# Patient Record
Sex: Female | Born: 1995 | Race: Black or African American | Hispanic: No | Marital: Married | Smoking: Never smoker
Health system: Southern US, Community
[De-identification: ages and names within clinical notes are randomized; demographics above are authoritative.]

---
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
## 2016-10-18 ENCOUNTER — Encounter (HOSPITAL_COMMUNITY): Payer: Self-pay | Admitting: Emergency Medicine

## 2016-10-18 ENCOUNTER — Emergency Department (HOSPITAL_COMMUNITY): Payer: BLUE CROSS/BLUE SHIELD

## 2016-10-18 ENCOUNTER — Emergency Department (HOSPITAL_COMMUNITY)
Admission: EM | Admit: 2016-10-18 | Discharge: 2016-10-18 | Disposition: A | Discharge status: Home / Self Care | Payer: BLUE CROSS/BLUE SHIELD | Attending: Emergency Medicine | Admitting: Emergency Medicine

## 2016-10-18 DIAGNOSIS — Y999 Unspecified external cause status: Secondary | ICD-10-CM | POA: Diagnosis not present

## 2016-10-18 DIAGNOSIS — Y939 Activity, unspecified: Secondary | ICD-10-CM | POA: Diagnosis not present

## 2016-10-18 DIAGNOSIS — W228XXA Striking against or struck by other objects, initial encounter: Secondary | ICD-10-CM | POA: Diagnosis not present

## 2016-10-18 DIAGNOSIS — Y92009 Unspecified place in unspecified non-institutional (private) residence as the place of occurrence of the external cause: Secondary | ICD-10-CM | POA: Insufficient documentation

## 2016-10-18 DIAGNOSIS — S90112A Contusion of left great toe without damage to nail, initial encounter: Secondary | ICD-10-CM | POA: Diagnosis not present

## 2016-10-18 DIAGNOSIS — S99922A Unspecified injury of left foot, initial encounter: Secondary | ICD-10-CM | POA: Diagnosis present

## 2016-10-18 MED ORDER — OXYCODONE HCL 5 MG PO TABS
5.0000 mg | ORAL_TABLET | Freq: Once | ORAL | Status: AC
  Administered 2016-10-18: 5 mg via ORAL
  Filled 2016-10-18: qty 1

## 2016-10-18 MED ORDER — HYDROCODONE-ACETAMINOPHEN 5-325 MG PO TABS: 1.0000 | ORAL_TABLET | Freq: Four times a day (QID) | ORAL | 0 refills | Status: DC | PRN

## 2016-10-18 MED ORDER — ACETAMINOPHEN 500 MG PO TABS
1000.0000 mg | ORAL_TABLET | Freq: Once | ORAL | Status: AC
  Administered 2016-10-18: 1000 mg via ORAL
  Filled 2016-10-18: qty 2

## 2016-10-18 NOTE — ED Provider Notes (Signed)
[facility]-EMERGENCY DEPT Provider Note   CSN: [id number] Arrival date & time: 10/18/16  1649  By signing my name below, I, Orpah CobbMaurice Copeland, attest that this documentation has been prepared under the direction and in the presence of Felicie Mornavid Cayle Cordoba, NP-C. Electronically Signed: Orpah CobbMaurice Copeland , ED Scribe. 10/18/16. 6:48 PM.    History   Chief Complaint Chief Complaint  Patient presents with  . Foot Injury    HPI  HPI Comments: Anne Cortez is a 20 y.o. female who presents to the Emergency Department complaining of moderate L great toe pain with sudden onset x1 hour. Pt states that while moving things around she stubbed her L great toe against . She states that the area is TTP and that ROM produces pain. Pt is on birth control. Pt denies hx of diabetes mellitus, any significant medical hx.    The history is provided by the patient. No language interpreter was used.  Foot Injury   The incident occurred 1 to 2 hours ago. The incident occurred at home. Injury mechanism: stubbed her L great toe against furniture. Pain location: L great toe. The pain is mild. The pain has been constant since onset. Pertinent negatives include no loss of motion. She reports no foreign bodies present. The symptoms are aggravated by palpation, bearing weight and activity. She has tried nothing for the symptoms.    History reviewed. No pertinent past medical history.  There are no active problems to display for this patient.   History reviewed. No pertinent surgical history.  OB History    No data available       Home Medications    Prior to Admission medications   Not on File    Family History No family history on file.  Social History Social History  Substance Use Topics  . Smoking status: Not on file  . Smokeless tobacco: Not on file  . Alcohol use Not on file     Allergies   Shellfish allergy   Review of Systems Review of Systems  Constitutional: Negative for chills and  fever.  HENT: Negative for ear pain and sore throat.   Eyes: Negative for pain and visual disturbance.  Respiratory: Negative for cough and shortness of breath.   Cardiovascular: Negative for chest pain and palpitations.  Gastrointestinal: Negative for abdominal pain and vomiting.  Genitourinary: Negative for dysuria and hematuria.  Musculoskeletal: Negative for arthralgias and back pain.  Skin: Positive for wound (L great toe with subungual hematoma). Negative for color change and rash.  Neurological: Negative for seizures and syncope.  All other systems reviewed and are negative.    Physical Exam Updated Vital Signs BP 120/69 (BP Location: Right Arm)   Pulse 73   Temp 98.6 F (37 C) (Oral)   Resp 18   Ht 5\' 4"  (1.626 m)   Wt 116 lb (52.6 kg)   LMP 10/07/2016   SpO2 100%   BMI 19.91 kg/m   Physical Exam  Constitutional: She appears well-developed and well-nourished. No distress.  HENT:  Head: Normocephalic and atraumatic.  Eyes: Conjunctivae are normal.  Neck: Neck supple.  Cardiovascular: Normal rate and regular rhythm.   No murmur heard. Pulmonary/Chest: Effort normal and breath sounds normal. No respiratory distress.  Abdominal: Soft. There is no tenderness.  Musculoskeletal: She exhibits no edema.       Left foot: There is tenderness.       Feet:  Neurological: She is alert.  Skin: Skin is warm and dry.  Psychiatric:  She has a normal mood and affect.  Nursing note and vitals reviewed.      ED Treatments / Results   DIAGNOSTIC STUDIES: Oxygen Saturation is 100% on RA, normal by my interpretation.   COORDINATION OF CARE: 6:49 PM-Discussed next steps with pt. Pt verbalized understanding and is agreeable with the plan.    Labs (all labs ordered are listed, but only abnormal results are displayed) Labs Reviewed - No data to display  EKG  EKG Interpretation None       Radiology Dg Foot Complete Left  Result Date: 10/18/2016 CLINICAL DATA:   Left foot pain bleeding great toe after injury EXAM: LEFT FOOT - COMPLETE 3+ VIEW COMPARISON:  None. FINDINGS: Three views of the left foot submitted. No acute fracture or subluxation. There is bandage artifact distal aspect great toe. IMPRESSION: Negative. Electronically Signed   By: Natasha MeadLiviu  Pop M.D.   On: 10/18/2016 17:46    Procedures Procedures (including critical care time)  Medications Ordered in ED Medications - No data to display   Initial Impression / Assessment and Plan / ED Course  I have reviewed the triage vital signs and the nursing notes.  Pertinent labs & imaging results that were available during my care of the patient were reviewed by me and considered in my medical decision making (see chart for details).  Clinical Course   Patient X-Ray negative for obvious fracture or dislocation.  Patient has small subungual hematoma that was drained in ED.  Conservative therapy recommended and discussed. Follow-up with ortho in no improvement. Patient will be discharged home & is agreeable with above plan. Returns precautions discussed. Pt appears safe for discharge.    Final Clinical Impressions(s) / ED Diagnoses   Final diagnoses:  Contusion of left great toe without damage to nail, initial encounter    New Prescriptions Discharge Medication List as of 10/18/2016  9:55 PM    START taking these medications   Details  HYDROcodone-acetaminophen (NORCO/VICODIN) 5-325 MG tablet Take 1 tablet by mouth every 6 (six) hours as needed for severe pain., Starting Thu 10/18/2016, Print       I personally performed the services described in this documentation, which was scribed in my presence. The recorded information has been reviewed and is accurate.    Felicie Mornavid Rc Amison, NP 10/19/16 [id number]    Gwyneth SproutWhitney Plunkett, MD 10/20/16 [phone]

## 2016-10-18 NOTE — ED Triage Notes (Signed)
Pt reports dropping a candle on her left toe and has a laceration on left great toe. Pt states its difficult to walk.

## 2016-10-18 NOTE — ED Notes (Signed)
Pt given "Know Your Options" handout and "Free E-Visit" coupon upon discharge.   Pt departed in NAD, refused use of wheelchair. Meeting friend out front for ride home.

## 2018-01-11 IMAGING — DX DG FOOT COMPLETE 3+V*L*
3 series · 3 of 3 positions shown · non-contrast
Comparison: None.

CLINICAL DATA: Left foot pain bleeding great toe after injury

EXAM:
LEFT FOOT - COMPLETE 3+ VIEW

[x foot ap left]
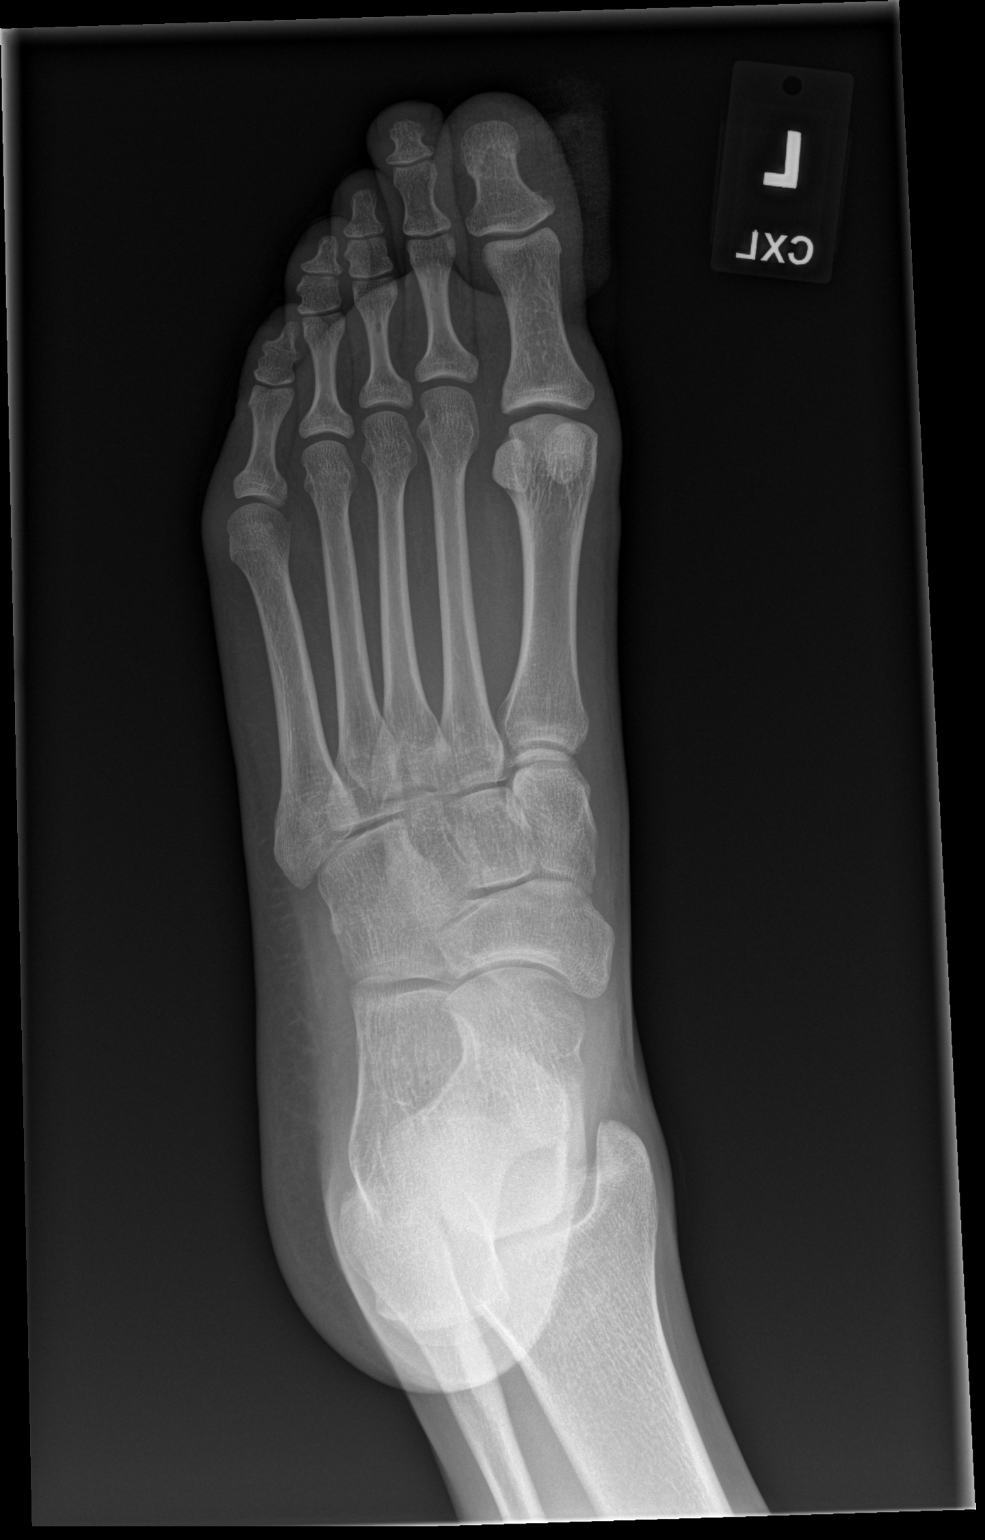

[x foot obl left]
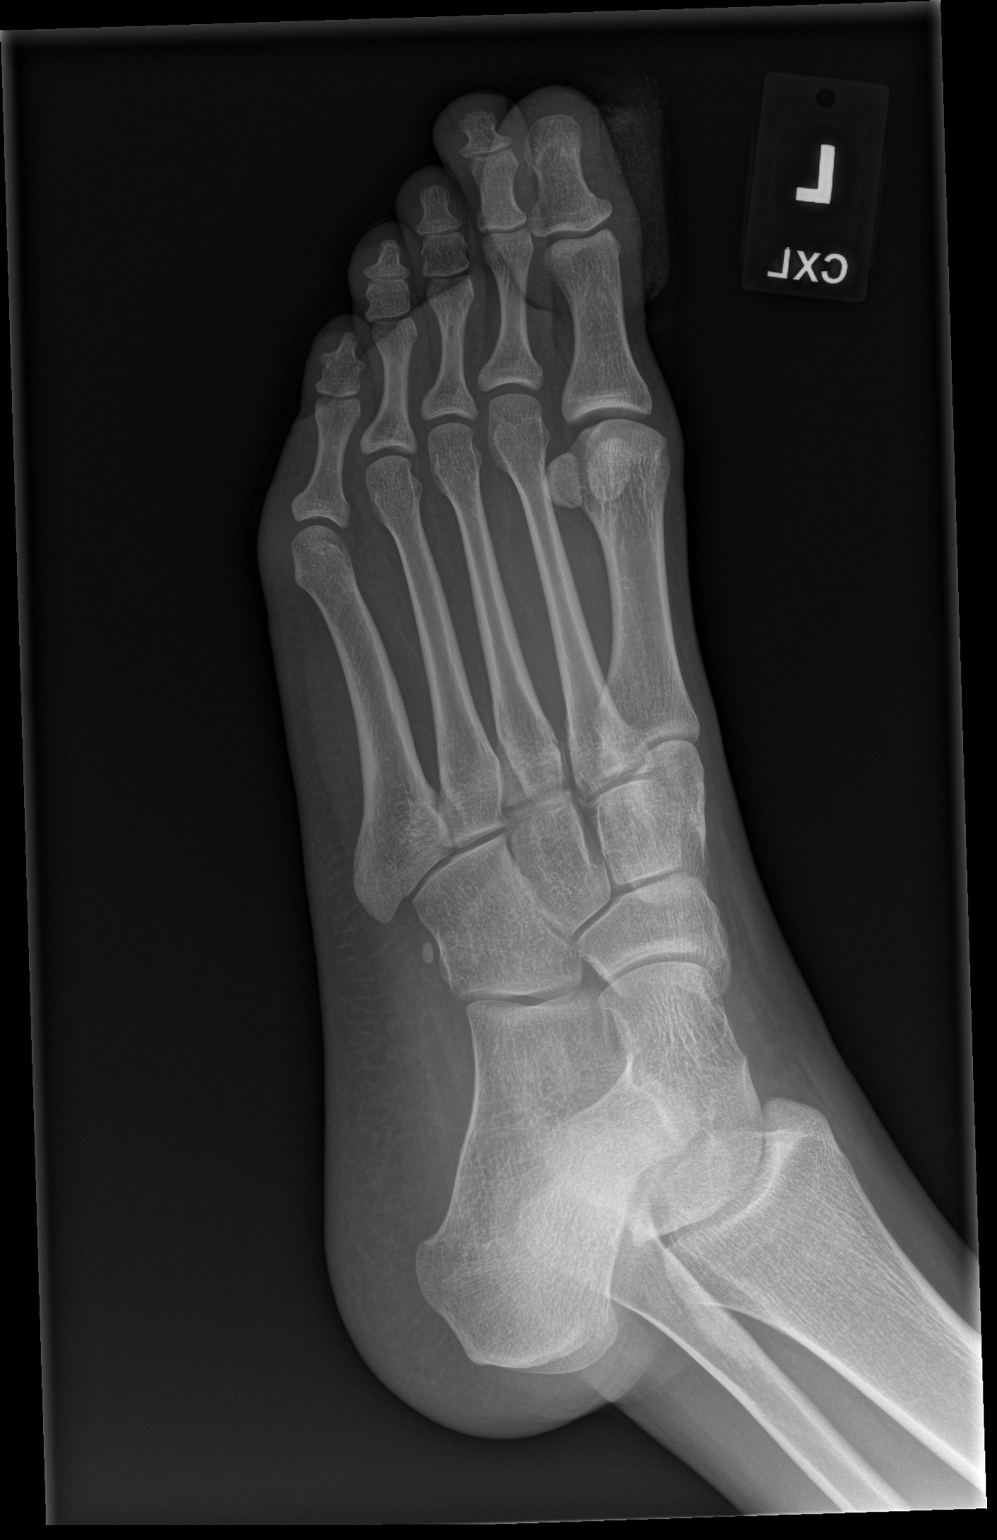

[x foot lat left]
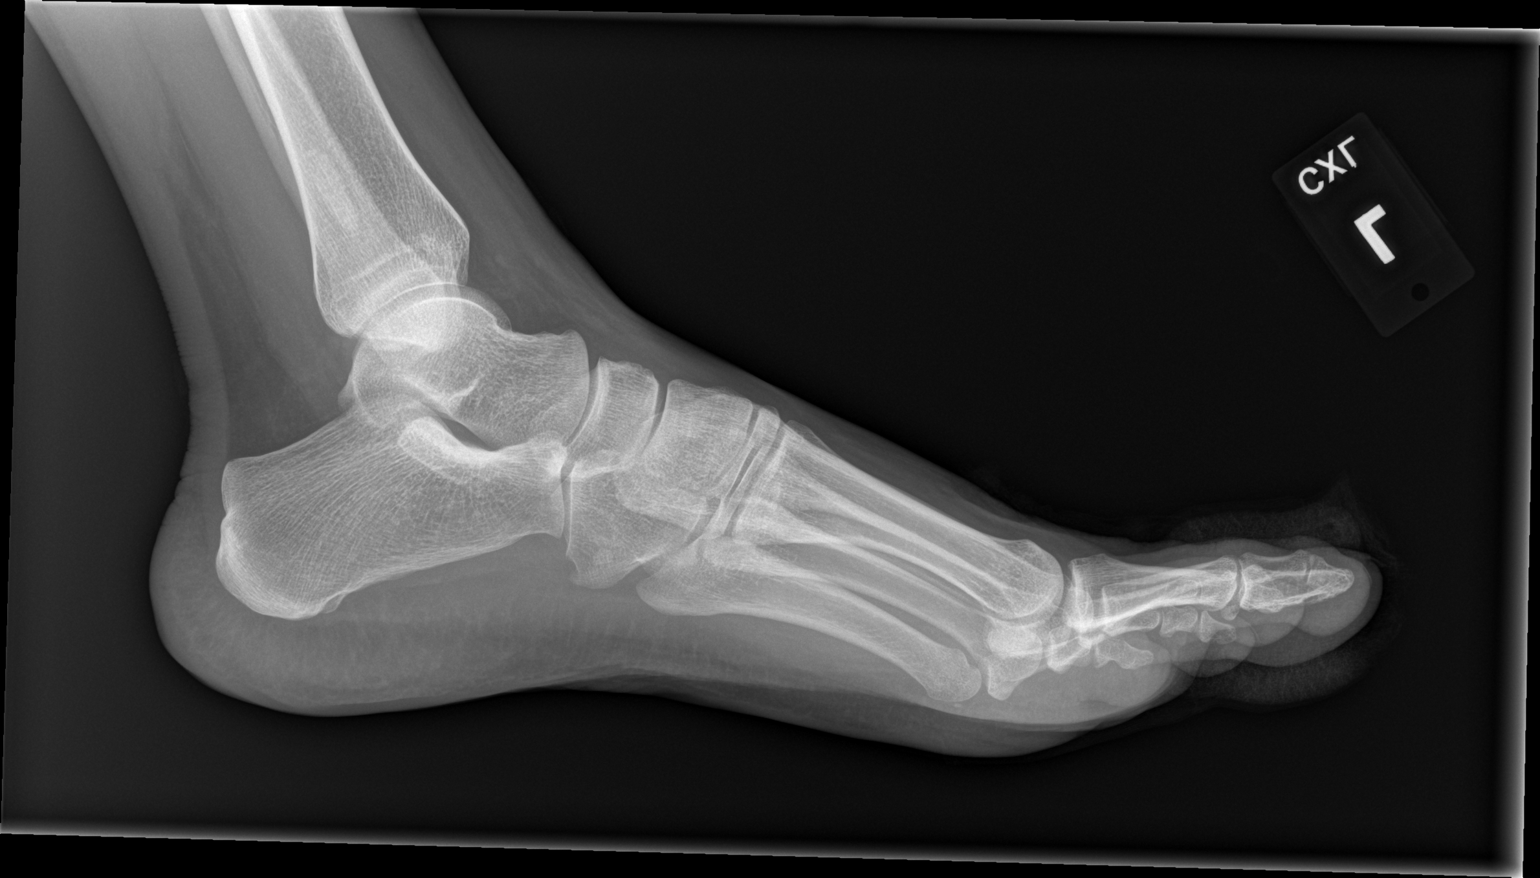

[3 of 3 positions shown; findings below may reference images not displayed]

FINDINGS: Three views of the left foot submitted. No acute fracture or
subluxation. There is bandage artifact distal aspect great toe.
IMPRESSION: Negative.

## 2019-03-23 ENCOUNTER — Other Ambulatory Visit: Payer: Self-pay

## 2019-03-23 DIAGNOSIS — R21 Rash and other nonspecific skin eruption: Secondary | ICD-10-CM | POA: Insufficient documentation

## 2019-03-24 ENCOUNTER — Emergency Department (HOSPITAL_COMMUNITY)
Admission: EM | Admit: 2019-03-24 | Discharge: 2019-03-24 | Disposition: A | Discharge status: Home / Self Care | Payer: BLUE CROSS/BLUE SHIELD | Attending: Emergency Medicine | Admitting: Emergency Medicine

## 2019-03-24 ENCOUNTER — Other Ambulatory Visit: Payer: Self-pay

## 2019-03-24 ENCOUNTER — Encounter (HOSPITAL_COMMUNITY): Payer: Self-pay | Admitting: Emergency Medicine

## 2019-03-24 DIAGNOSIS — R21 Rash and other nonspecific skin eruption: Secondary | ICD-10-CM

## 2019-03-24 MED ORDER — DIPHENHYDRAMINE HCL 25 MG PO CAPS
25.0000 mg | ORAL_CAPSULE | Freq: Once | ORAL | Status: AC
  Administered 2019-03-24: 25 mg via ORAL
  Filled 2019-03-24: qty 1

## 2019-03-24 MED ORDER — DIPHENHYDRAMINE HCL 25 MG PO TABS
25.0000 mg | ORAL_TABLET | Freq: Three times a day (TID) | ORAL | 0 refills | Status: AC | PRN
Start: 2019-03-24 — End: ?

## 2019-03-24 MED ORDER — PRENATAL MULTIVITAMIN CH: 1.0000 | ORAL_TABLET | Freq: Every day | ORAL | 0 refills | Status: AC

## 2019-03-24 NOTE — ED Triage Notes (Signed)
Pt arriving POV with complaint of rash that is covering her entire body. Pt reports the rash is itchy but not painful. Rash presents as small bumps, no whitehead.

## 2019-03-24 NOTE — ED Provider Notes (Signed)
[facility]-EMERGENCY DEPT Provider Note   CSN: [id number] Arrival date & time: 03/23/19  2335    History   Chief Complaint Chief Complaint  Patient presents with  . Rash    HPI Anne A Blase MessHaynesworth is a 23 y.o. female.     HPI  This is a 23 year old G1, P0 female currently [redacted] weeks pregnant by last menstrual period who presents with a rash.  Patient reports over the last 2 to 3 days she has developed a rash "all over my body."  She reports rash on the extremities, trunk, back, and buttock.  She denies any new exposures including soaps, detergents, lotions, food, medications.  She reports that the rash is itchy and nonpainful.  She denies any fevers or systemic symptoms.  She has not taken anything for her symptoms.  She denies any tick exposures or rash noted on the palms or the soles.  Regarding her pregnancy, she has not seen her OB/GYN yet.  She reports vomiting but no loss of fluids or vaginal bleeding.  She denies any abdominal pain.  After initial evaluation, patient reports that she is having some abdominal cramping.  She reports that it is crampy at a level of 2 out of 10.  It does not lateralize to the right or to the left.  She has had intermittent episodes of crampiness since finding out that she was pregnant.  Denies any vaginal bleeding.  History reviewed. No pertinent past medical history.  There are no active problems to display for this patient.   History reviewed. No pertinent surgical history.   OB History    Gravida  1   Para      Term      Preterm      AB      Living        SAB      TAB      Ectopic      Multiple      Live Births               Home Medications    Prior to Admission medications   Medication Sig Start Date End Date Taking? Authorizing Provider  diphenhydrAMINE (BENADRYL) 25 MG tablet Take 1 tablet (25 mg total) by mouth every 8 (eight) hours as needed for itching. 03/24/19   Horton, Mayer Maskerourtney F, MD   HYDROcodone-acetaminophen (NORCO/VICODIN) 5-325 MG tablet Take 1 tablet by mouth every 6 (six) hours as needed for severe pain. 10/18/16   Felicie MornSmith, David, NP  Prenatal Vit-Fe Fumarate-FA (PRENATAL MULTIVITAMIN) TABS tablet Take 1 tablet by mouth daily at 12 noon. 03/24/19   Horton, Mayer Maskerourtney F, MD    Family History History reviewed. No pertinent family history.  Social History Social History   Tobacco Use  . Smoking status: Not on file  Substance Use Topics  . Alcohol use: Not on file  . Drug use: Not on file     Allergies   Shellfish allergy   Review of Systems Review of Systems  Constitutional: Negative for fever.  Respiratory: Negative for shortness of breath.   Cardiovascular: Negative for chest pain.  Gastrointestinal: Positive for nausea and vomiting. Negative for abdominal pain.  Genitourinary: Negative for vaginal bleeding and vaginal discharge.  Skin: Positive for rash.  Neurological: Negative for headaches.  All other systems reviewed and are negative.    Physical Exam Updated Vital Signs BP 111/71 (BP Location: Left Arm)   Pulse 68   Temp 98.4 F (36.9  C) (Oral)   Resp 14   Ht 1.6 m (5\' 3" )   Wt 51.7 kg   SpO2 100%   BMI 20.19 kg/m   Physical Exam Vitals signs and nursing note reviewed.  Constitutional:      Appearance: She is well-developed. She is not ill-appearing.  HENT:     Head: Normocephalic and atraumatic.     Mouth/Throat:     Mouth: Mucous membranes are moist.  Eyes:     Pupils: Pupils are equal, round, and reactive to light.  Cardiovascular:     Rate and Rhythm: Normal rate and regular rhythm.  Pulmonary:     Effort: Pulmonary effort is normal. No respiratory distress.  Abdominal:     General: Abdomen is flat.     Tenderness: There is no abdominal tenderness. There is no guarding or rebound.     Comments: Abdomen is soft and nontender  Musculoskeletal:     Right lower leg: No edema.     Left lower leg: No edema.  Skin:     General: Skin is warm and dry.     Comments: Fine papular rash noted over the bilateral upper extremities, back, buttock, trunks, legs, spares palms and soles, no significant erythema  Neurological:     Mental Status: She is alert and oriented to person, place, and time.  Psychiatric:        Mood and Affect: Mood normal.      ED Treatments / Results  Labs (all labs ordered are listed, but only abnormal results are displayed) Labs Reviewed - No data to display  EKG None  Radiology No results found.  Procedures Procedures (including critical care time)  Medications Ordered in ED Medications  diphenhydrAMINE (BENADRYL) capsule 25 mg (25 mg Oral Given 03/24/19 0038)     Initial Impression / Assessment and Plan / ED Course  I have reviewed the triage vital signs and the nursing notes.  Pertinent labs & imaging results that were available during my care of the patient were reviewed by me and considered in my medical decision making (see chart for details).        Patient presents with a rash.  Reports that it is itchy in nature.  Denies any infectious symptoms or systemic symptoms.  She is overall nontoxic vital signs are reassuring.  Given distribution of the rash mostly over areas of the body that would touch clothing, suspect a contact dermatitis.  She may have an increase sensitivity to soap or detergent.  Patient was given Benadryl.  I discussed with her that this is safe to take during pregnancy.  Regarding her abdominal cramping, her abdominal exam is benign.  She has no lateralizing or point tenderness.  She has no vaginal bleeding.  Patient was reassured.  I have low suspicion for ectopic pregnancy at this time.  Recommend close follow-up with OB/GYN as scheduled.  She will start a prenatal vitamin.  She was given strict return precautions.  After history, exam, and medical workup I feel the patient has been appropriately medically screened and is safe for discharge home.  Pertinent diagnoses were discussed with the patient. Patient was given return precautions.   Final Clinical Impressions(s) / ED Diagnoses   Final diagnoses:  Rash    ED Discharge Orders         Ordered    diphenhydrAMINE (BENADRYL) 25 MG tablet  Every 8 hours PRN     03/24/19 0119    Prenatal Vit-Fe Fumarate-FA (PRENATAL MULTIVITAMIN) TABS  tablet  Daily     03/24/19 0119           Shon Baton, MD 03/24/19 [phone]

## 2019-03-24 NOTE — Discharge Instructions (Addendum)
You were seen today for rash.  This is most consistent with a likely contact dermatitis.  Avoid soaps with scents or dyes.  Take Benadryl as needed.  Start a prenatal vitamin and follow-up closely with your OB/GYN.  If you develop vaginal bleeding, pain that localizes to the right or left lower quadrant, you need to be reevaluated immediately.

## 2019-06-20 ENCOUNTER — Encounter (HOSPITAL_COMMUNITY): Payer: Self-pay

## 2019-06-20 ENCOUNTER — Other Ambulatory Visit: Payer: Self-pay

## 2019-06-20 ENCOUNTER — Emergency Department (HOSPITAL_COMMUNITY)
Admission: EM | Admit: 2019-06-20 | Discharge: 2019-06-20 | Disposition: A | Discharge status: Home / Self Care | Payer: Self-pay | Attending: Emergency Medicine | Admitting: Emergency Medicine

## 2019-06-20 DIAGNOSIS — K0889 Other specified disorders of teeth and supporting structures: Secondary | ICD-10-CM | POA: Insufficient documentation

## 2019-06-20 MED ORDER — HYDROCODONE-ACETAMINOPHEN 5-325 MG PO TABS: 1.0000 | ORAL_TABLET | Freq: Four times a day (QID) | ORAL | 0 refills | Status: DC | PRN

## 2019-06-20 MED ORDER — HYDROCODONE-ACETAMINOPHEN 5-325 MG PO TABS
1.0000 | ORAL_TABLET | Freq: Once | ORAL | Status: AC
  Administered 2019-06-20: 1 via ORAL
  Filled 2019-06-20: qty 1

## 2019-06-20 MED ORDER — PENICILLIN V POTASSIUM 500 MG PO TABS: 500.0000 mg | ORAL_TABLET | Freq: Four times a day (QID) | ORAL | 0 refills | Status: AC

## 2019-06-20 NOTE — ED Triage Notes (Signed)
Patient states she has "excruciating pain" related to her wisdom teeth coming in. The patient states that this pain has been recurring for 3 months, but the last 3 days have been exceptionally painful. Patient is alert and oriented and rates her pain at a 10.

## 2019-06-20 NOTE — Discharge Instructions (Addendum)
Take the pain medications as prescribed.  Follow-up with dentistry. Take 800 mg of Ibuprofen for pain every 8 hours in rotation with Tylenol if no chance of pregnancy. Do not take Ibuprofen if chance of pregnancy.

## 2019-06-20 NOTE — ED Provider Notes (Signed)
[facility] EMERGENCY DEPARTMENT Provider Note   CSN: [id number] Arrival date & time: 06/20/19  1716    History   Chief Complaint Chief Complaint  Patient presents with  . Dental Pain    wisdom teeth arte coming in; they are visible     HPI Anne Cortez is a 23 y.o. female patient past medical history who presents for evaluation of dental pain.  Patient states she has had pain located to her lower posterior molars intermittently over the last 3 months however worse over the last 3 days.  Patient states she noticed 3 days ago she was able to see the tops of her molars coming in.  States she has been taking Tylenol at home with mild relief in her pain.  She has not taken ibuprofen or naproxen.  She rates her current pain a 10/10.  States she is unable to drink cold liquids as she gets sharp pains to her back lower dentition.  Denies fever, chills, nausea, vomiting, drooling, dysphasia, trismus, facial swelling, neck pain, neck stiffness.  She is tolerating p.o. intake at home.  She does not have follow-up with dentistry.  Denies additional aggravating or alleviating factors. Denies chance of pregnancy.  History obtained from patient and past medical records.  No interpreter was used.     HPI  History reviewed. No pertinent past medical history.  There are no active problems to display for this patient.   History reviewed. No pertinent surgical history.   OB History    Gravida  1   Para      Term      Preterm      AB      Living        SAB      TAB      Ectopic      Multiple      Live Births               Home Medications    Prior to Admission medications   Medication Sig Start Date End Date Taking? Authorizing Provider  diphenhydrAMINE (BENADRYL) 25 MG tablet Take 1 tablet (25 mg total) by mouth every 8 (eight) hours as needed for itching. 03/24/19   Horton, Mayer Masker, MD  HYDROcodone-acetaminophen (NORCO/VICODIN) 5-325 MG  tablet Take 1 tablet by mouth every 6 (six) hours as needed for severe pain. 10/18/16   Felicie Morn, NP  penicillin v potassium (VEETID) 500 MG tablet Take 1 tablet (500 mg total) by mouth 4 (four) times daily for 7 days. 06/20/19 06/27/19  Xyon Lukasik A, PA-C  Prenatal Vit-Fe Fumarate-FA (PRENATAL MULTIVITAMIN) TABS tablet Take 1 tablet by mouth daily at 12 noon. 03/24/19   Horton, Mayer Masker, MD    Family History No family history on file.  Social History Social History   Tobacco Use  . Smoking status: Never Smoker  . Smokeless tobacco: Never Used  Substance Use Topics  . Alcohol use: Not Currently  . Drug use: Yes    Types: Marijuana     Allergies   Shellfish allergy   Review of Systems Review of Systems  Constitutional: Negative.   HENT: Positive for dental problem. Negative for congestion, drooling, ear discharge, ear pain, facial swelling, mouth sores, nosebleeds, postnasal drip, rhinorrhea, sinus pressure, sinus pain, sneezing, sore throat and trouble swallowing.   Eyes: Negative.   Respiratory: Negative.   Cardiovascular: Negative.   Gastrointestinal: Negative for nausea and vomiting.  Musculoskeletal: Negative for neck pain  and neck stiffness.  Skin: Negative for rash.  Neurological: Negative for dizziness, weakness, light-headedness and headaches.  All other systems reviewed and are negative.    Physical Exam Updated Vital Signs BP (!) 128/106   Pulse (!) 108   Temp 98.2 F (36.8 C) (Oral)   Resp 16   Ht 5\' 3"  (1.6 m)   Wt 53.5 kg   LMP 05/12/2019 (Approximate)   SpO2 99%   Breastfeeding No   BMI 20.90 kg/m   Physical Exam Vitals signs and nursing note reviewed.  Constitutional:      General: She is not in acute distress.    Appearance: She is well-developed. She is not ill-appearing, toxic-appearing or diaphoretic.  HENT:     Head: Normocephalic and atraumatic.     Jaw: There is normal jaw occlusion. No trismus, tenderness, swelling, pain on  movement or malocclusion.     Comments: No Submandibular swelling or evidence of Ludwig's angina    Nose: Nose normal.     Mouth/Throat:     Lips: Pink.     Mouth: Mucous membranes are moist.     Dentition: Does not have dentures. Dental tenderness present. No gingival swelling, dental caries, dental abscesses or gum lesions.     Pharynx: Oropharynx is clear. Uvula midline.     Tonsils: No tonsillar exudate or tonsillar abscesses.      Comments: Patient with erupting back lower posterior molars.  Mild gingival erythema surrounding dentition without drainable periapical abscess.  Mucous membranes moist.  No drooling, dysphasia or trismus.  No obvious dental caries or abnormal fractured dentition.  Sublingual area soft.  No oral lesions. Eyes:     Pupils: Pupils are equal, round, and reactive to light.  Neck:     Musculoskeletal: Normal range of motion.     Trachea: Phonation normal.     Comments: No neck stiffness or neck rigidity. Cardiovascular:     Rate and Rhythm: Normal rate.  Pulmonary:     Effort: No respiratory distress.  Abdominal:     General: There is no distension.  Musculoskeletal: Normal range of motion.  Skin:    General: Skin is warm and dry.     Capillary Refill: Capillary refill takes less than 2 seconds.     Comments: No rashes or lesions.  Neurological:     Mental Status: She is alert.     Comments: Cranial nerves 2-12 grossly intact.    ED Treatments / Results  Labs (all labs ordered are listed, but only abnormal results are displayed) Labs Reviewed - No data to display  EKG None  Radiology No results found.  Procedures Procedures (including critical care time)  Medications Ordered in ED Medications  HYDROcodone-acetaminophen (NORCO/VICODIN) 5-325 MG per tablet 1 tablet (has no administration in time range)    Initial Impression / Assessment and Plan / ED Course  I have reviewed the triage vital signs and the nursing notes.  Pertinent labs &  imaging results that were available during my care of the patient were reviewed by me and considered in my medical decision making (see chart for details).  23 year old presents for evaluation of dental pain.  Patient has obvious newly erupting lower dentition molars.  Mild gingival erythema without evidence of drainable periapical abscess.  No drooling, dysphasia or trismus.  Sublingual area soft.  No sub-mandibular swelling or evidence of Ludwig's angina. Will treat with penicillin and anti-inflammatories medicine.  Urged patient to follow-up with dentist.  Resources given.  The  patient has been appropriately medically screened and/or stabilized in the ED. I have low suspicion for any other emergent medical condition which would require further screening, evaluation or treatment in the ED or require inpatient management.      Final Clinical Impressions(s) / ED Diagnoses   Final diagnoses:  Pain, dental    ED Discharge Orders         Ordered    penicillin v potassium (VEETID) 500 MG tablet  4 times daily     06/20/19 1737           Deran Barro A, PA-C 06/20/19 1738    Malvin Johns, MD 06/20/19 1925

## 2019-06-20 NOTE — ED Notes (Signed)
Patient is resting in a position of comfort.

## 2021-09-05 ENCOUNTER — Emergency Department (HOSPITAL_COMMUNITY)
Admission: EM | Admit: 2021-09-05 | Discharge: 2021-09-05 | Disposition: A | Discharge status: Home / Self Care | Payer: Self-pay | Attending: Emergency Medicine | Admitting: Emergency Medicine

## 2021-09-05 ENCOUNTER — Encounter (HOSPITAL_COMMUNITY): Payer: Self-pay

## 2021-09-05 DIAGNOSIS — K047 Periapical abscess without sinus: Secondary | ICD-10-CM | POA: Insufficient documentation

## 2021-09-05 DIAGNOSIS — J029 Acute pharyngitis, unspecified: Secondary | ICD-10-CM | POA: Insufficient documentation

## 2021-09-05 MED ORDER — HYDROCODONE-ACETAMINOPHEN 5-325 MG PO TABS: 1.0000 | ORAL_TABLET | Freq: Four times a day (QID) | ORAL | 0 refills | Status: AC | PRN

## 2021-09-05 MED ORDER — HYDROCODONE-ACETAMINOPHEN 5-325 MG PO TABS
1.0000 | ORAL_TABLET | Freq: Once | ORAL | Status: AC
  Administered 2021-09-05: 1 via ORAL
  Filled 2021-09-05: qty 1

## 2021-09-05 MED ORDER — CLINDAMYCIN HCL 150 MG PO CAPS: 150.0000 mg | ORAL_CAPSULE | Freq: Four times a day (QID) | ORAL | 0 refills | Status: AC

## 2021-09-05 NOTE — ED Triage Notes (Signed)
Pt reports left lower dental pain that woke her up this morning.  C/o sore throat (left side)  10/10 pain  Denies seeing a dentist.   A[facility] Ambulatory in triage

## 2021-09-05 NOTE — Discharge Instructions (Addendum)
The attached offices should be able to help you and your infection.  I have sent 2 days worth of pain medication and 7 days of antibiotics to your pharmacy.  You may also use over-the-counter ibuprofen but do not use Tylenol at the same time as the prescribed pain medication because that medication already has Tylenol built in.  It was a pleasure to meet you and I hope that you feel better.

## 2021-09-05 NOTE — ED Provider Notes (Signed)
[facility]   CSN: [id number] Arrival date & time: 09/05/21  0703     History Chief Complaint  Patient presents with   Dental Pain    Anne Cortez is a 25 y.o. female with a past medical history of dental caries presenting today with complaint of left-sided dental pain.  Reports that she has had a problem with her left lower wisdom tooth, however does not have dental insurance to get it pulled.  This morning she woke up with large amounts of pain in the area radiating down her jaw.  Reports also has some sore throat.  No fevers, chills, bleeding or discharge.  Reports this happened 2 years ago and she was given medications and antibiotics that helped her.      History reviewed. No pertinent past medical history.  There are no problems to display for this patient.   History reviewed. No pertinent surgical history.   OB History     Gravida  1   Para      Term      Preterm      AB      Living         SAB      IAB      Ectopic      Multiple      Live Births              No family history on file.  Social History   Tobacco Use   Smoking status: Never   Smokeless tobacco: Never  Substance Use Topics   Alcohol use: Not Currently   Drug use: Yes    Types: Marijuana    Home Medications Prior to Admission medications   Medication Sig Start Date End Date Taking? Authorizing Provider  diphenhydrAMINE (BENADRYL) 25 MG tablet Take 1 tablet (25 mg total) by mouth every 8 (eight) hours as needed for itching. 03/24/19   Horton, Mayer Masker, MD  HYDROcodone-acetaminophen (NORCO/VICODIN) 5-325 MG tablet Take 1 tablet by mouth every 6 (six) hours as needed. 09/05/21   Coy Vandoren A, PA-C  Prenatal Vit-Fe Fumarate-FA (PRENATAL MULTIVITAMIN) TABS tablet Take 1 tablet by mouth daily at 12 noon. 03/24/19   Horton, Mayer Masker, MD    Allergies    Shellfish allergy  Review of Systems   Review of Systems   Constitutional:  Negative for chills and fever.  HENT:  Positive for sore throat. Negative for drooling.   Respiratory:  Negative for shortness of breath.   Neurological:  Negative for numbness.  All other systems reviewed and are negative.  Physical Exam Updated Vital Signs BP (!) 134/101 (BP Location: Right Arm)   Pulse 98   Temp (!) 97.2 F (36.2 C) (Oral)   Resp 18   LMP 08/05/2021   SpO2 100%   Physical Exam Vitals and nursing note reviewed.  Constitutional:      Appearance: Normal appearance. She is not ill-appearing.  HENT:     Head: Normocephalic and atraumatic.     Mouth/Throat:     Mouth: Mucous membranes are moist.     Pharynx: Oropharynx is clear. No posterior oropharyngeal erythema.     Comments: Erupted lower wisdom teeth bilaterally.  Inflammation and some foggy discharge around left lower tooth.  Airway patent.  No trismus.  No drainable abscess. Eyes:     General: No scleral icterus.    Conjunctiva/sclera: Conjunctivae normal.  Cardiovascular:     Rate and Rhythm: Normal  rate and regular rhythm.  Pulmonary:     Effort: Pulmonary effort is normal. No respiratory distress.  Skin:    Findings: No rash.  Neurological:     Mental Status: She is alert.  Psychiatric:        Mood and Affect: Mood normal.    ED Results / Procedures / Treatments   Labs (all labs ordered are listed, but only abnormal results are displayed) Labs Reviewed - No data to display  EKG None  Radiology No results found.  Procedures Procedures   Medications Ordered in ED Medications  HYDROcodone-acetaminophen (NORCO/VICODIN) 5-325 MG per tablet 1 tablet (has no administration in time range)    ED Course  I have reviewed the triage vital signs and the nursing notes.  Pertinent labs & imaging results that were available during my care of the patient were reviewed by me and considered in my medical decision making (see chart for details).    MDM  Rules/Calculators/A&P  Patient was fully evaluated by me.  She was stable, tolerating secretions.  Denied difficulty breathing.  No trismus.  No signs of abscess.  At this time I believe the patient is a candidate for antibiotic treatment, pain management and dental referral.  Final Clinical Impression(s) / ED Diagnoses Final diagnoses:  Dental infection    Rx / DC Orders ED Discharge Orders          Ordered    HYDROcodone-acetaminophen (NORCO/VICODIN) 5-325 MG tablet  Every 6 hours PRN        09/05/21 0858          Results and diagnoses were explained to the patient. Return precautions discussed in full. Patient had no additional questions and expressed complete understanding.    Woodroe Chen 09/05/21 1047    Derwood Kaplan, MD 09/06/21 1525

## 2024-06-20 ENCOUNTER — Other Ambulatory Visit: Payer: Self-pay

## 2024-06-20 ENCOUNTER — Emergency Department (HOSPITAL_BASED_OUTPATIENT_CLINIC_OR_DEPARTMENT_OTHER): Payer: Self-pay

## 2024-06-20 ENCOUNTER — Emergency Department (HOSPITAL_BASED_OUTPATIENT_CLINIC_OR_DEPARTMENT_OTHER)
Admission: EM | Admit: 2024-06-20 | Discharge: 2024-06-20 | Disposition: A | Discharge status: Home / Self Care | Payer: Self-pay | Attending: Emergency Medicine | Admitting: Emergency Medicine

## 2024-06-20 ENCOUNTER — Encounter (HOSPITAL_BASED_OUTPATIENT_CLINIC_OR_DEPARTMENT_OTHER): Payer: Self-pay | Admitting: Emergency Medicine

## 2024-06-20 DIAGNOSIS — J988 Other specified respiratory disorders: Secondary | ICD-10-CM | POA: Diagnosis not present

## 2024-06-20 DIAGNOSIS — R059 Cough, unspecified: Secondary | ICD-10-CM | POA: Diagnosis present

## 2024-06-20 DIAGNOSIS — J45909 Unspecified asthma, uncomplicated: Secondary | ICD-10-CM | POA: Insufficient documentation

## 2024-06-20 LAB — RESP PANEL BY RT-PCR (RSV, FLU A&B, COVID)  RVPGX2
Influenza A by PCR: NEGATIVE
Influenza B by PCR: NEGATIVE
Resp Syncytial Virus by PCR: NEGATIVE
SARS Coronavirus 2 by RT PCR: NEGATIVE

## 2024-06-20 MED ORDER — AEROCHAMBER PLUS FLO-VU MEDIUM MISC
1.0000 | Freq: Once | Status: AC
  Administered 2024-06-20: 1

## 2024-06-20 MED ORDER — PREDNISONE 20 MG PO TABS: ORAL_TABLET | ORAL | 0 refills | Status: AC

## 2024-06-20 MED ORDER — IPRATROPIUM-ALBUTEROL 0.5-2.5 (3) MG/3ML IN SOLN
3.0000 mL | Freq: Once | RESPIRATORY_TRACT | Status: AC
  Administered 2024-06-20: 3 mL via RESPIRATORY_TRACT
  Filled 2024-06-20: qty 3

## 2024-06-20 MED ORDER — PREDNISONE 50 MG PO TABS
60.0000 mg | ORAL_TABLET | Freq: Once | ORAL | Status: AC
  Administered 2024-06-20: 60 mg via ORAL
  Filled 2024-06-20: qty 1

## 2024-06-20 NOTE — Discharge Instructions (Signed)
Use your inhaler every 4 hours(4 puffs) while awake, return for sudden worsening shortness of breath, or if you need to use your inhaler more often.  ° °

## 2024-06-20 NOTE — ED Provider Notes (Signed)
 [place] EMERGENCY DEPARTMENT AT [facility] Provider Note   CSN: [id number] Arrival date & time: 06/20/24  2038     Patient presents with: Shortness of Breath   Anne Cortez is a 28 y.o. female.   28 yo F with a chief complaints of difficulty breathing.  She said for about 6 hours she has been coughing and feeling like she has been having trouble catching her breath.  She denies history of asthma but said at some point they told her that she might have benefit from an inhaler and so has 1 at home.  She tried to use it but is not sure if she is using it right and seem like it was helping all that much.  Has some pain with coughing.  Took some DayQuil without much improvement either.   Shortness of Breath      Prior to Admission medications   Medication Sig Start Date End Date Taking? Authorizing Provider  predniSONE  (DELTASONE ) 20 MG tablet 2 tabs po daily x 4 days 06/20/24  Yes Emil Share, DO  clindamycin  (CLEOCIN ) 150 MG capsule Take 1 capsule (150 mg total) by mouth every 6 (six) hours. 09/05/21   Redwine, Madison A, PA-C  diphenhydrAMINE  (BENADRYL ) 25 MG tablet Take 1 tablet (25 mg total) by mouth every 8 (eight) hours as needed for itching. 03/24/19   Horton, Charmaine FALCON, MD  HYDROcodone -acetaminophen  (NORCO/VICODIN) 5-325 MG tablet Take 1 tablet by mouth every 6 (six) hours as needed. 09/05/21   Redwine, Madison A, PA-C  Prenatal Vit-Fe Fumarate-FA (PRENATAL MULTIVITAMIN) TABS tablet Take 1 tablet by mouth daily at 12 noon. 03/24/19   Horton, Charmaine FALCON, MD    Allergies: Shellfish allergy    Review of Systems  Respiratory:  Positive for shortness of breath.     Updated Vital Signs BP (!) 129/94   Pulse 80   Temp 98.2 F (36.8 C) (Oral)   Resp (!) 30   Ht 5' 4 (1.626 m)   Wt 54.4 kg   SpO2 96%   BMI 20.60 kg/m   Physical Exam Vitals and nursing note reviewed.  Constitutional:      General: She is not in acute distress.    Appearance: She is  well-developed. She is not diaphoretic.  HENT:     Head: Normocephalic and atraumatic.  Eyes:     Pupils: Pupils are equal, round, and reactive to light.  Cardiovascular:     Rate and Rhythm: Normal rate and regular rhythm.     Heart sounds: No murmur heard.    No friction rub. No gallop.  Pulmonary:     Effort: Pulmonary effort is normal.     Breath sounds: No wheezing or rales.     Comments: Bronchospastic cough.  Faint end expiratory wheezes Abdominal:     General: There is no distension.     Palpations: Abdomen is soft.     Tenderness: There is no abdominal tenderness.  Musculoskeletal:        General: No tenderness.     Cervical back: Normal range of motion and neck supple.  Skin:    General: Skin is warm and dry.  Neurological:     Mental Status: She is alert and oriented to person, place, and time.  Psychiatric:        Behavior: Behavior normal.     (all labs ordered are listed, but only abnormal results are displayed) Labs Reviewed  RESP PANEL BY RT-PCR (RSV, FLU A&B, COVID)  [id number]    EKG: None  Radiology: [facility] Chest Port 1 View Result Date: 06/20/2024 CLINICAL DATA:  cough, sob EXAM: PORTABLE CHEST 1 VIEW COMPARISON:  None Available. FINDINGS: The heart and mediastinal contours are within normal limits. No focal consolidation. No pulmonary edema. No pleural effusion. No pneumothorax. No acute osseous abnormality. IMPRESSION: No active disease. Electronically Signed   By: Morgane  Naveau M.D.   On: 06/20/2024 21:18     Procedures   Medications Ordered in the ED  ipratropium-albuterol  (DUONEB) 0.5-2.5 (3) MG/3ML nebulizer solution 3 mL (3 mLs Nebulization Given 06/20/24 2102)  predniSONE  (DELTASONE ) tablet 60 mg (60 mg Oral Given 06/20/24 2151)  AeroChamber Plus Flo-Vu Medium MISC 1 each (1 each Other Given 06/20/24 2157)                                    Medical Decision Making Amount and/or Complexity of Data Reviewed Radiology:  ordered.  Risk Prescription drug management.   28 yo F with a chief complaints of cough and difficulty breathing.  Patient says that she has no history of asthma but was prescribed an inhaler in the past.  She does have what sounds like reactive airway disease on exam.  Will trial neb treatment here.  X-ray.  Reassess.  Plain film of the chest independently interpreted by me without focal infiltrate or pneumothorax.  Patient feeling much better after a DuoNeb.  Burst dose of steroids.  PCP follow-up.  10:02 PM:  I have discussed the diagnosis/risks/treatment options with the patient.  Evaluation and diagnostic testing in the emergency department does not suggest an emergent condition requiring admission or immediate intervention beyond what has been performed at this time.  They will follow up with PCP. We also discussed returning to the ED immediately if new or worsening sx occur. We discussed the sx which are most concerning (e.g., sudden worsening pain, fever, inability to tolerate by mouth, sob, need to use inhaler more often than every 4 hours) that necessitate immediate return. Medications administered to the patient during their visit and any new prescriptions provided to the patient are listed below.  Medications given during this visit Medications  ipratropium-albuterol  (DUONEB) 0.5-2.5 (3) MG/3ML nebulizer solution 3 mL (3 mLs Nebulization Given 06/20/24 2102)  predniSONE  (DELTASONE ) tablet 60 mg (60 mg Oral Given 06/20/24 2151)  AeroChamber Plus Flo-Vu Medium MISC 1 each (1 each Other Given 06/20/24 2157)     The patient appears reasonably screen and/or stabilized for discharge and I doubt any other medical condition or other [facility] requiring further screening, evaluation, or treatment in the ED at this time prior to discharge.       Final diagnoses:  Wheezing-associated respiratory infection (WARI)    ED Discharge Orders          Ordered    predniSONE  (DELTASONE ) 20 MG tablet         06/20/24 2146               Emil Share, DO 06/20/24 2202

## 2024-06-20 NOTE — ED Triage Notes (Signed)
 Pt sts she feels SHOB x 6 hrs; denies hx of asthma; np cough noted
